# Patient Record
Sex: Male | Born: 1985 | Race: White | Hispanic: No | Marital: Married | Smoking: Former smoker
Health system: Southern US, Community
[De-identification: ages and names within clinical notes are randomized; demographics above are authoritative.]

## PROBLEM LIST (undated history)

## (undated) DIAGNOSIS — G510 Bell's palsy: Secondary | ICD-10-CM

## (undated) DIAGNOSIS — Q369 Cleft lip, unilateral: Secondary | ICD-10-CM

## (undated) HISTORY — PX: HIP SURGERY: SHX245

## (undated) HISTORY — DX: Bell's palsy: G51.0

---
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
## 2010-09-24 ENCOUNTER — Ambulatory Visit (INDEPENDENT_AMBULATORY_CARE_PROVIDER_SITE_OTHER): Payer: Self-pay

## 2010-09-24 ENCOUNTER — Inpatient Hospital Stay (INDEPENDENT_AMBULATORY_CARE_PROVIDER_SITE_OTHER)
Admission: RE | Admit: 2010-09-24 | Discharge: 2010-09-24 | Disposition: A | Discharge status: Home / Self Care | Payer: Self-pay | Source: Ambulatory Visit | Attending: Family Medicine | Admitting: Family Medicine

## 2010-09-24 DIAGNOSIS — IMO0002 Reserved for concepts with insufficient information to code with codable children: Secondary | ICD-10-CM

## 2010-09-24 DIAGNOSIS — X58XXXA Exposure to other specified factors, initial encounter: Secondary | ICD-10-CM

## 2010-12-24 ENCOUNTER — Emergency Department (HOSPITAL_COMMUNITY)
Admission: EM | Admit: 2010-12-24 | Discharge: 2010-12-24 | Disposition: A | Discharge status: Home / Self Care | Payer: Self-pay | Attending: Emergency Medicine | Admitting: Emergency Medicine

## 2010-12-24 ENCOUNTER — Encounter: Payer: Self-pay | Admitting: *Deleted

## 2010-12-24 DIAGNOSIS — G510 Bell's palsy: Secondary | ICD-10-CM | POA: Insufficient documentation

## 2010-12-24 DIAGNOSIS — R29810 Facial weakness: Secondary | ICD-10-CM | POA: Insufficient documentation

## 2010-12-24 DIAGNOSIS — H9209 Otalgia, unspecified ear: Secondary | ICD-10-CM | POA: Insufficient documentation

## 2010-12-24 MED ORDER — PREDNISONE 10 MG PO TABS: 20.0000 mg | ORAL_TABLET | Freq: Two times a day (BID) | ORAL | Status: AC

## 2010-12-24 NOTE — ED Provider Notes (Signed)
History     CSN: [id number] Arrival date & time: 12/24/2010  8:28 PM   First MD Initiated Contact with Patient 12/24/10 2234      Chief Complaint  Patient presents with  . Ear Fullness    (Consider location/radiation/quality/duration/timing/severity/associated sxs/prior treatment) Patient is a 25 y.o. male presenting with plugged ear sensation. The history is provided by the patient.  Ear Fullness This is a new problem. The current episode started today.   Patient developed left ear pain 2 days ago today he woke up and the left side of his face was not moving like the rest of his face. He states that when he tries to smile his lips won't move. He denies any weakness in any extremities. Or generalized weakness. No change in vision, no headaches, no change in breathing, shortness of breath, chest pain. This has never happened to the patient before. Patient is a healthy young male, with no significant past medical history. History reviewed. No pertinent past medical history.Marland Kitchen  History reviewed. No pertinent past surgical history.  History reviewed. No pertinent family history.  History  Substance Use Topics  . Smoking status: Current Everyday Smoker  . Smokeless tobacco: Not on file  . Alcohol Use: Yes      Review of Systems  Allergies  Review of patient's allergies indicates no known allergies.  Home Medications   Current Outpatient Rx  Name Route Sig Dispense Refill  . OVER THE COUNTER MEDICATION Left Ear Place 3-4 drops into the left ear 4 (four) times daily as needed. Hyland's earache drops       BP 118/53  Pulse 66  Temp(Src) 98.1 F (36.7 C) (Oral)  Resp 18  SpO2 96%  Physical Exam  Vitals reviewed. Constitutional: He is oriented to person, place, and time. He appears well-developed and well-nourished.  HENT:  Head: Normocephalic and atraumatic.  Eyes: EOM are normal. Pupils are equal, round, and reactive to light.  Neck: Normal range of motion.    Cardiovascular: Normal rate and regular rhythm.   Pulmonary/Chest: Effort normal and breath sounds normal.  Musculoskeletal: Normal range of motion.  Neurological: He is alert and oriented to person, place, and time. No cranial nerve deficit (motor function of the facial nerve not physiologic.).  Skin: Skin is warm and dry.    ED Course  Procedures (including critical care time)  Labs Reviewed - No data to display No results found.   No diagnosis found.    MDM  Pt does not have risk factors for stroke, no pain, no generalized or focal weakness. Symptoms are purely motor of the left facial nerve.       Dorthula Matas, PA 12/24/10 2300

## 2010-12-24 NOTE — ED Notes (Signed)
Pt presents with left sided facial numbness and left ear fullness for the past 2 days.  No hx of same.  Other neuro exam normal.  Grip strength equal and bilateral, oriented x 3.

## 2010-12-24 NOTE — ED Notes (Signed)
The pt has had some lt ear pain for 2 days and now his lt face is numb

## 2010-12-25 NOTE — ED Provider Notes (Signed)
Medical screening examination/treatment/procedure(s) were performed by non-physician practitioner and as supervising physician I was immediately available for consultation/collaboration.   Shelda Jakes, MD 12/25/10 [phone]

## 2011-03-29 ENCOUNTER — Ambulatory Visit: Payer: Self-pay | Admitting: Family Medicine

## 2011-03-29 VITALS — BP 123/65 | HR 68 | Temp 98.7°F | Resp 16 | Ht 68.5 in | Wt 171.2 lb

## 2011-03-29 DIAGNOSIS — L259 Unspecified contact dermatitis, unspecified cause: Secondary | ICD-10-CM

## 2011-03-29 DIAGNOSIS — R21 Rash and other nonspecific skin eruption: Secondary | ICD-10-CM

## 2011-03-29 DIAGNOSIS — L309 Dermatitis, unspecified: Secondary | ICD-10-CM

## 2011-03-29 MED ORDER — TRIAMCINOLONE ACETONIDE 0.1 % EX CREA: TOPICAL_CREAM | Freq: Two times a day (BID) | CUTANEOUS | Status: DC

## 2011-03-29 MED ORDER — SULFAMETHOXAZOLE-TRIMETHOPRIM 800-160 MG PO TABS: 1.0000 | ORAL_TABLET | Freq: Two times a day (BID) | ORAL | Status: AC

## 2011-03-29 NOTE — Progress Notes (Signed)
[facility]:  Office Visit  Chief Complaint:  Chief Complaint  Patient presents with  . Rash    L arm x 2 wks    HPI: David Schmitt is a 26 y.o. male who complains of rash on left upper and lower arm x 2 weeks, started off as small area on antecubital area but started spreading because of scratching due to itching, some serosainguinous drainage. Patient has multiple tattoos along left arm.  Tried antibacterial soap and eczema cream without relief. Denies any recent travels, new detergents or soaps, food allergies, new clothing/sheets, insect bites, pet/animal exposure, camping, antibiotic use. Denies any h/o asthma, allergies, eczema.   Past Medical History  Diagnosis Date  . Bell's palsy    History reviewed. No pertinent past surgical history. History   Social History  . Marital Status: Single    Spouse Name: N/A    Number of Children: N/A  . Years of Education: N/A   Social History Main Topics  . Smoking status: Current Everyday Smoker -- 1.0 packs/day for 5 years    Types: Cigarettes  . Smokeless tobacco: None  . Alcohol Use: Yes     weekends  . Drug Use: No  . Sexually Active: Yes   Other Topics Concern  . None   Social History Narrative  . None   No family history on file. No Known Allergies Prior to Admission medications   Medication Sig Start Date End Date Taking? Authorizing Provider  OVER THE COUNTER MEDICATION Place 3-4 drops into the left ear 4 (four) times daily as needed. Hyland's earache drops     Historical Provider, MD     ROS: The patient denies fevers, chills, night sweats, unintentional weight loss, chest pain, palpitations, wheezing, dyspnea on exertion, nausea, vomiting, abdominal pain, dysuria, hematuria, melena, numbness, weakness, or tingling. + rash  All other systems have been reviewed and were otherwise negative with the exception of those mentioned in the HPI and as above.    PHYSICAL EXAM: Filed Vitals:   03/29/11 1631  BP: 123/65  Pulse: 68  Temp: 98.7 F (37.1 C)  Resp: 16   Filed Vitals:   03/29/11 1631  Height: 5' 8.5" (1.74 m)  Weight: 171 lb 3.2 oz (77.656 kg)   Body mass index is 25.65 kg/(m^2).  General: Alert, no acute distress HEENT:  Normocephalic, atraumatic, oropharynx patent. Cardiovascular:  Regular rate and rhythm, no rubs murmurs or gallops.  No Carotid bruits, radial pulse intact. No pedal edema.  Respiratory: Clear to auscultation bilaterally.  No wheezes, rales, or rhonchi.  No cyanosis, no use of accessory musculature GI: No organomegaly, abdomen is soft and non-tender, positive bowel sounds.  No masses. Skin: ++ Excoriated rash, macular papular rash diffuse on left arm. This is mixed in with the tattoos along his arm--tattoo sleeve Neurologic: Facial musculature symmetric. Psychiatric: Patient is appropriate throughout our interaction. Lymphatic: No axillary or cervical lymphadenopathy Musculoskeletal: Gait intact.   ASSESSMENT/PLAN: Encounter Diagnoses  Name Primary?  . Rash and nonspecific skin eruption Yes  . Dermatitis    1. Rx Triamcinolone for itching and Bactrim for excoriation and prevention of possible secondary infection. Patient given risk and benefits of steroid cream use for prolong period of time, he should be mindful of pigmentation changes to his tattoos. Recommended that he try a small area first to see if there are any SEs.   2. F/u prn    Alacia Rehmann PHUONG, DO 03/29/2011 4:52 PM

## 2012-01-19 ENCOUNTER — Emergency Department (HOSPITAL_COMMUNITY): Payer: Self-pay

## 2012-01-19 ENCOUNTER — Encounter (HOSPITAL_COMMUNITY): Payer: Self-pay | Admitting: *Deleted

## 2012-01-19 ENCOUNTER — Emergency Department (HOSPITAL_COMMUNITY)
Admission: EM | Admit: 2012-01-19 | Discharge: 2012-01-19 | Disposition: A | Discharge status: Home / Self Care | Payer: No Typology Code available for payment source | Attending: Emergency Medicine | Admitting: Emergency Medicine

## 2012-01-19 DIAGNOSIS — F17200 Nicotine dependence, unspecified, uncomplicated: Secondary | ICD-10-CM | POA: Insufficient documentation

## 2012-01-19 DIAGNOSIS — Y9389 Activity, other specified: Secondary | ICD-10-CM | POA: Insufficient documentation

## 2012-01-19 DIAGNOSIS — S199XXA Unspecified injury of neck, initial encounter: Secondary | ICD-10-CM | POA: Insufficient documentation

## 2012-01-19 DIAGNOSIS — S0993XA Unspecified injury of face, initial encounter: Secondary | ICD-10-CM | POA: Insufficient documentation

## 2012-01-19 DIAGNOSIS — S298XXA Other specified injuries of thorax, initial encounter: Secondary | ICD-10-CM | POA: Insufficient documentation

## 2012-01-19 DIAGNOSIS — Z87720 Personal history of (corrected) congenital malformations of eye: Secondary | ICD-10-CM | POA: Insufficient documentation

## 2012-01-19 DIAGNOSIS — S3981XA Other specified injuries of abdomen, initial encounter: Secondary | ICD-10-CM | POA: Insufficient documentation

## 2012-01-19 DIAGNOSIS — Y92410 Unspecified street and highway as the place of occurrence of the external cause: Secondary | ICD-10-CM | POA: Insufficient documentation

## 2012-01-19 DIAGNOSIS — T07XXXA Unspecified multiple injuries, initial encounter: Secondary | ICD-10-CM | POA: Insufficient documentation

## 2012-01-19 DIAGNOSIS — Z8669 Personal history of other diseases of the nervous system and sense organs: Secondary | ICD-10-CM | POA: Insufficient documentation

## 2012-01-19 HISTORY — DX: Cleft lip, unilateral: Q36.9

## 2012-01-19 LAB — COMPREHENSIVE METABOLIC PANEL
AST: 26 U/L (ref 0–37)
Albumin: 3.9 g/dL (ref 3.5–5.2)
Alkaline Phosphatase: 43 U/L (ref 39–117)
BUN: 10 mg/dL (ref 6–23)
Chloride: 101 mEq/L (ref 96–112)
Creatinine, Ser: 0.89 mg/dL (ref 0.50–1.35)
Potassium: 3.2 mEq/L — ABNORMAL LOW (ref 3.5–5.1)
Total Protein: 7.1 g/dL (ref 6.0–8.3)

## 2012-01-19 LAB — URINALYSIS, ROUTINE W REFLEX MICROSCOPIC
Glucose, UA: NEGATIVE mg/dL
Leukocytes, UA: NEGATIVE
Nitrite: NEGATIVE
Specific Gravity, Urine: 1.005 (ref 1.005–1.030)
pH: 5.5 (ref 5.0–8.0)

## 2012-01-19 LAB — RAPID URINE DRUG SCREEN, HOSP PERFORMED
Amphetamines: NOT DETECTED
Benzodiazepines: NOT DETECTED
Cocaine: NOT DETECTED

## 2012-01-19 LAB — CBC WITH DIFFERENTIAL/PLATELET
Basophils Absolute: 0 10*3/uL (ref 0.0–0.1)
Basophils Relative: 0 % (ref 0–1)
Eosinophils Absolute: 0.1 10*3/uL (ref 0.0–0.7)
Hemoglobin: 16.1 g/dL (ref 13.0–17.0)
MCH: 32.6 pg (ref 26.0–34.0)
MCHC: 35.8 g/dL (ref 30.0–36.0)
Monocytes Relative: 6 % (ref 3–12)
Neutro Abs: 5 10*3/uL (ref 1.7–7.7)
Neutrophils Relative %: 69 % (ref 43–77)
RDW: 12.2 % (ref 11.5–15.5)

## 2012-01-19 LAB — ETHANOL: Alcohol, Ethyl (B): 198 mg/dL — ABNORMAL HIGH (ref 0–11)

## 2012-01-19 MED ORDER — IBUPROFEN 600 MG PO TABS: 600.0000 mg | ORAL_TABLET | Freq: Four times a day (QID) | ORAL | Status: DC | PRN

## 2012-01-19 MED ORDER — POTASSIUM CHLORIDE CRYS ER 20 MEQ PO TBCR
20.0000 meq | EXTENDED_RELEASE_TABLET | Freq: Once | ORAL | Status: AC
  Administered 2012-01-19: 20 meq via ORAL
  Filled 2012-01-19: qty 1

## 2012-01-19 MED ORDER — IOHEXOL 300 MG/ML  SOLN
100.0000 mL | Freq: Once | INTRAMUSCULAR | Status: AC | PRN
  Administered 2012-01-19: 100 mL via INTRAVENOUS

## 2012-01-19 MED ORDER — SODIUM CHLORIDE 0.9 % IV SOLN
Freq: Once | INTRAVENOUS | Status: AC
  Administered 2012-01-19: 02:00:00 via INTRAVENOUS

## 2012-01-19 NOTE — ED Notes (Signed)
Pt ambulated to restroom and maintained o2 stats.
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
## 2012-01-19 NOTE — ED Notes (Addendum)
Radiology called to inform RN that ems IV had infiltrated. Radiology dc IV and restarted another IV

## 2012-01-19 NOTE — ED Provider Notes (Signed)
Medical screening examination/treatment/procedure(s) were performed by non-physician practitioner and as supervising physician I was immediately available for consultation/collaboration.  Sunnie Nielsen, MD 01/19/12 [phone]
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
## 2014-11-18 ENCOUNTER — Ambulatory Visit (INDEPENDENT_AMBULATORY_CARE_PROVIDER_SITE_OTHER): Payer: Self-pay | Admitting: Family Medicine

## 2014-11-18 VITALS — BP 112/68 | HR 72 | Temp 98.9°F | Resp 18 | Ht 70.0 in | Wt 213.4 lb

## 2014-11-18 DIAGNOSIS — M25561 Pain in right knee: Secondary | ICD-10-CM

## 2014-11-18 DIAGNOSIS — L03115 Cellulitis of right lower limb: Secondary | ICD-10-CM

## 2014-11-18 MED ORDER — DOXYCYCLINE HYCLATE 100 MG PO CAPS: 100.0000 mg | ORAL_CAPSULE | Freq: Two times a day (BID) | ORAL | Status: DC

## 2014-11-18 MED ORDER — NAPROXEN SODIUM 550 MG PO TABS: 550.0000 mg | ORAL_TABLET | Freq: Two times a day (BID) | ORAL | Status: DC

## 2014-11-18 NOTE — Patient Instructions (Addendum)
Cellulitis Cellulitis is an infection of the skin and the tissue beneath it. The infected area is usually red and tender. Cellulitis occurs most often in the arms and lower legs.  CAUSES  Cellulitis is caused by bacteria that enter the skin through cracks or cuts in the skin. The most common types of bacteria that cause cellulitis are staphylococci and streptococci. SIGNS AND SYMPTOMS   Redness and warmth.  Swelling.  Tenderness or pain.  Fever. DIAGNOSIS  Your health care provider can usually determine what is wrong based on a physical exam. Blood tests may also be done. TREATMENT  Treatment usually involves taking an antibiotic medicine. HOME CARE INSTRUCTIONS   Take your antibiotic medicine as directed by your health care provider. Finish the antibiotic even if you start to feel better.  Keep the infected arm or leg elevated to reduce swelling.  Apply a warm cloth to the affected area up to 4 times per day to relieve pain.  Take medicines only as directed by your health care provider.  Keep all follow-up visits as directed by your health care provider. SEEK MEDICAL CARE IF:   You notice red streaks coming from the infected area.  Your red area gets larger or turns dark in color.  Your bone or joint underneath the infected area becomes painful after the skin has healed.  Your infection returns in the same area or another area.  You notice a swollen bump in the infected area.  You develop new symptoms.  You have a fever. SEEK IMMEDIATE MEDICAL CARE IF:   You feel very sleepy.  You develop vomiting or diarrhea.  You have a general ill feeling (malaise) with muscle aches and pains.   This information is not intended to replace advice given to you by your health care provider. Make sure you discuss any questions you have with your health care provider.   Document Released: 10/31/2004 Document Revised: 10/12/2014 Document Reviewed: 04/08/2011 Elsevier Interactive  Patient Education 2016 Elsevier Inc.    Doxycycline tablets or capsules What is this medicine? DOXYCYCLINE (dox i SYE kleen) is a tetracycline antibiotic. It kills certain bacteria or stops their growth. It is used to treat many kinds of infections, like dental, skin, respiratory, and urinary tract infections. It also treats acne, Lyme disease, malaria, and certain sexually transmitted infections. This medicine may be used for other purposes; ask your health care provider or pharmacist if you have questions. What should I tell my health care provider before I take this medicine? They need to know if you have any of these conditions: -liver disease -long exposure to sunlight like working outdoors -stomach problems like colitis -an unusual or allergic reaction to doxycycline, tetracycline antibiotics, other medicines, foods, dyes, or preservatives -pregnant or trying to get pregnant -breast-feeding How should I use this medicine? Take this medicine by mouth with a full glass of water. Follow the directions on the prescription label. It is best to take this medicine without food, but if it upsets your stomach take it with food. Take your medicine at regular intervals. Do not take your medicine more often than directed. Take all of your medicine as directed even if you think you are better. Do not skip doses or stop your medicine early. Talk to your pediatrician regarding the use of this medicine in children. While this drug may be prescribed for selected conditions, precautions do apply. Overdosage: If you think you have taken too much of this medicine contact a poison control center or  emergency room at once. NOTE: This medicine is only for you. Do not share this medicine with others. What if I miss a dose? If you miss a dose, take it as soon as you can. If it is almost time for your next dose, take only that dose. Do not take double or extra doses. What may interact with this  medicine? -antacids -barbiturates -birth control pills -bismuth subsalicylate -carbamazepine -methoxyflurane -other antibiotics -phenytoin -vitamins that contain iron -warfarin This list may not describe all possible interactions. Give your health care provider a list of all the medicines, herbs, non-prescription drugs, or dietary supplements you use. Also tell them if you smoke, drink alcohol, or use illegal drugs. Some items may interact with your medicine. What should I watch for while using this medicine? Tell your doctor or health care professional if your symptoms do not improve. Do not treat diarrhea with over the counter products. Contact your doctor if you have diarrhea that lasts more than 2 days or if it is severe and watery. Do not take this medicine just before going to bed. It may not dissolve properly when you lay down and can cause pain in your throat. Drink plenty of fluids while taking this medicine to also help reduce irritation in your throat. This medicine can make you more sensitive to the sun. Keep out of the sun. If you cannot avoid being in the sun, wear protective clothing and use sunscreen. Do not use sun lamps or tanning beds/booths. Birth control pills may not work properly while you are taking this medicine. Talk to your doctor about using an extra method of birth control. If you are being treated for a sexually transmitted infection, avoid sexual contact until you have finished your treatment. Your sexual partner may also need treatment. Avoid antacids, aluminum, calcium, magnesium, and iron products for 4 hours before and 2 hours after taking a dose of this medicine. If you are using this medicine to prevent malaria, you should still protect yourself from contact with mosquitos. Stay in screened-in areas, use mosquito nets, keep your body covered, and use an insect repellent. What side effects may I notice from receiving this medicine? Side effects that you  should report to your doctor or health care professional as soon as possible: -allergic reactions like skin rash, itching or hives, swelling of the face, lips, or tongue -difficulty breathing -fever -itching in the rectal or genital area -pain on swallowing -redness, blistering, peeling or loosening of the skin, including inside the mouth -severe stomach pain or cramps -unusual bleeding or bruising -unusually weak or tired -yellowing of the eyes or skin Side effects that usually do not require medical attention (report to your doctor or health care professional if they continue or are bothersome): -diarrhea -loss of appetite -nausea, vomiting This list may not describe all possible side effects. Call your doctor for medical advice about side effects. You may report side effects to FDA at 1-800-FDA-1088. Where should I keep my medicine? Keep out of the reach of children. Store at room temperature, below 30 degrees C (86 degrees F). Protect from light. Keep container tightly closed. Throw away any unused medicine after the expiration date. Taking this medicine after the expiration date can make you seriously ill. NOTE: This sheet is a summary. It may not cover all possible information. If you have questions about this medicine, talk to your doctor, pharmacist, or health care provider.    2016, Elsevier/Gold Standard. (2014-05-13 12:10:28)

## 2014-11-18 NOTE — Progress Notes (Signed)
    MRN: [id number] DOB: 03-20-1985  Subjective:   David Schmitt is a 29 y.o. male presenting for chief complaint of Cyst  Reports 2 day history of right knee pain, right knee swelling, redness, increasing tenderness. Has not tried any medications for relief. Of note, patient admits that he looked up some videos on Youtube and started picking at his wounds which subsequently worsened his knee pain, redness and swelling. Denies fever, drainage pus, bleeding, trauma, recent tattoos over affected area. Denies history of MRSA. Denies any other aggravating or relieving factors, no other questions or concerns.  David Schmitt has a current medication list which includes the following prescription(s): doxycycline and naproxen sodium. Also has No Known Allergies.  David Schmitt  has a past medical history of Bell's palsy and Cleft lip. Also  has past surgical history that includes Hip surgery.  Objective:   Vitals: BP 112/68 mmHg  Pulse 72  Temp(Src) 98.9 F (37.2 C) (Oral)  Resp 18  Ht 5\' 10"  (1.778 m)  Wt 213 lb 6.4 oz (96.798 kg)  BMI 30.62 kg/m2  SpO2 98%  Physical Exam  Constitutional: He is oriented to person, place, and time. He appears well-developed and well-nourished.  Cardiovascular: Normal rate.   Pulmonary/Chest: Effort normal.  Musculoskeletal:       Right knee: He exhibits decreased range of motion (flexion), swelling and erythema. He exhibits no effusion, no ecchymosis, no deformity and no laceration.       Legs: Multiple tattoos over right thigh and calf.  Neurological: He is alert and oriented to person, place, and time.  Skin: Skin is warm and dry. No rash noted. There is erythema. No pallor.    Assessment and Plan :   1. Cellulitis of right lower extremity 2. Right knee pain - Start doxycycline twice a day with food for 10 days. Patient may take Anaprox as well for pain and inflammation. He is to return to clinic in 3-4 days if he has no improvement or sooner if worsening  symptoms.  Wallis BambergMario Marium Ragan, PA-C [facility] [phone] 11/18/2014 4:28 PM  I have reviewed history and physical exam at the bedside and agree with the assessment and plan. Elvina SidleKurt Lauenstein M.D.

## 2015-06-02 ENCOUNTER — Other Ambulatory Visit: Payer: Self-pay | Admitting: Physician Assistant

## 2015-06-02 ENCOUNTER — Ambulatory Visit (INDEPENDENT_AMBULATORY_CARE_PROVIDER_SITE_OTHER): Payer: No Typology Code available for payment source | Admitting: Physician Assistant

## 2015-06-02 VITALS — BP 120/68 | HR 61 | Temp 98.4°F | Resp 14 | Ht 70.0 in | Wt 210.8 lb

## 2015-06-02 DIAGNOSIS — R519 Headache, unspecified: Secondary | ICD-10-CM

## 2015-06-02 DIAGNOSIS — R5383 Other fatigue: Secondary | ICD-10-CM | POA: Diagnosis not present

## 2015-06-02 DIAGNOSIS — R51 Headache: Secondary | ICD-10-CM | POA: Diagnosis not present

## 2015-06-02 LAB — BASIC METABOLIC PANEL
BUN: 18 mg/dL (ref 7–25)
CALCIUM: 9.5 mg/dL (ref 8.6–10.3)
CO2: 28 mmol/L (ref 20–31)
CREATININE: 0.92 mg/dL (ref 0.60–1.35)
Chloride: 99 mmol/L (ref 98–110)
GLUCOSE: 93 mg/dL (ref 65–99)
Potassium: 4.2 mmol/L (ref 3.5–5.3)
Sodium: 137 mmol/L (ref 135–146)

## 2015-06-02 LAB — POCT CBC
GRANULOCYTE PERCENT: 36.2 % — AB (ref 37–80)
HEMATOCRIT: 42.6 % — AB (ref 43.5–53.7)
Hemoglobin: 15.1 g/dL (ref 14.1–18.1)
Lymph, poc: 3.6 — AB (ref 0.6–3.4)
MCH, POC: 31.7 pg — AB (ref 27–31.2)
MCHC: 35.6 g/dL — AB (ref 31.8–35.4)
MCV: 89.3 fL (ref 80–97)
MID (CBC): 0.8 (ref 0–0.9)
MPV: 7.9 fL (ref 0–99.8)
POC GRANULOCYTE: 2.5 (ref 2–6.9)
POC LYMPH %: 52.5 % — AB (ref 10–50)
POC MID %: 11.3 % (ref 0–12)
Platelet Count, POC: 196 10*3/uL (ref 142–424)
RBC: 4.77 M/uL (ref 4.69–6.13)
RDW, POC: 12.8 %
WBC: 6.8 10*3/uL (ref 4.6–10.2)

## 2015-06-02 LAB — POCT SEDIMENTATION RATE: POCT SED RATE: 15 mm/h (ref 0–22)

## 2015-06-02 LAB — TSH: TSH: 1.44 mIU/L (ref 0.40–4.50)

## 2015-06-02 NOTE — Progress Notes (Signed)
[facility] [address] [place] [address] [phone]  Date:  06/02/2015   Name:  David Schmitt   DOB:  [birth year]   MRN:  [id number]  PCP:  Pcp Not In System   Chief Complaint  Patient presents with  . Migraine  . Fatigue   History of Present Illness:  David Schmitt is a 30 y.o. male patient who presents to [facility] for headache and fatigue.     Headache started 7 days ago.  Left temporal pain.  Taking aleve or advil which does not help.  Throbbing pain and the back of his head.  No nausea associated.  There is photosensitivity.  No vision changes or auras.  Mild dizziness.  The head pain is off and on.  No UR symptoms.  No pain with the pollen.  He will take 2 capsules of the aleve.  He has an infant at home, and has intermittent sleep.  He denies exposure to mononucleosis.  He has no personal or familial hx of thyroid disease.      There are no active problems to display for this patient.   Past Medical History  Diagnosis Date  . Bell's palsy   . Cleft lip     Past Surgical History  Procedure Laterality Date  . Hip surgery      Social History  Substance Use Topics  . Smoking status: Current Every Day Smoker -- 1.00 packs/day for 5 years    Types: Cigarettes  . Smokeless tobacco: None  . Alcohol Use: Yes     Comment: weekends    History reviewed. No pertinent family history.  No Known Allergies  Medication list has been reviewed and updated.  Current Outpatient Prescriptions on File Prior to Visit  Medication Sig Dispense Refill  . doxycycline (VIBRAMYCIN) 100 MG capsule Take 1 capsule (100 mg total) by mouth 2 (two) times daily. (Patient not taking: Reported on 06/02/2015) 20 capsule 0  . naproxen sodium (ANAPROX DS) 550 MG tablet Take 1 tablet (550 mg total) by mouth 2 (two) times daily with a meal. (Patient not taking: Reported on 06/02/2015) 30 tablet 0   No current facility-administered medications on file prior to visit.     ROS ROS otherwise unremarkable unless listed above.   Physical Examination: BP 120/68 mmHg  Pulse 61  Temp(Src) 98.4 F (36.9 C) (Oral)  Resp 14  Ht  (1.778 m)  Wt 210 lb 12.8 oz (95.618 kg)  BMI 30.25 kg/m2  SpO2 98% Ideal Body Weight: Weight in (lb) to have BMI = 25: 173.9  Physical Exam  Constitutional: He is oriented to person, place, and time. He appears well-developed and well-nourished. No distress.  HENT:  Head: Normocephalic and atraumatic.  Right Ear: External ear and ear canal normal.  Left Ear: External ear and ear canal normal.  Nose: Nasal deformity present. Right sinus exhibits no maxillary sinus tenderness and no frontal sinus tenderness. Left sinus exhibits no maxillary sinus tenderness and no frontal sinus tenderness.  Mouth/Throat: No oropharyngeal exudate, posterior oropharyngeal edema or posterior oropharyngeal erythema.  Eyes: Conjunctivae are normal. Pupils are equal, round, and reactive to light. Right eye exhibits normal extraocular motion. Left eye exhibits normal extraocular motion.  Cardiovascular: Normal rate.   Pulmonary/Chest: Effort normal. No respiratory distress.  Neurological: He is alert and oriented to person, place, and time. He has normal strength. No cranial nerve deficit.  Skin: Skin is warm and dry. He is not diaphoretic.  Psychiatric: He has a normal mood and affect. His behavior is normal.   Orthostatic VS for the past 24 hrs (Last 3 readings):  BP- Lying Pulse- Lying BP- Sitting Pulse- Sitting BP- Standing at 0 minutes Pulse- Standing at 0 minutes  06/02/15 1427 122/76 mmHg 50 117/74 mmHg 50 119/77 mmHg 54    Assessment and Plan: David Schmitt is a 30 y.o. male who is here today for cc of abdominal pain.   Have advised him to use Excedrin migraine at this time. His vitals are within normal limits I have ordered multiple labs to address this possible fatigue, however he does have a small child at home and does battle with  intermittent sleep due to that. Discussed alarming symptoms that would warrant an immediate return. I've advised heavy hydration and balanced meals.  Other fatigue - Plan: POCT CBC, POCT SEDIMENTATION RATE, Basic metabolic panel, TSH, HIV antibody  Left temporal headache - Plan: POCT CBC, POCT SEDIMENTATION RATE  Trena PlattStephanie English, PA-C [facility] 06/02/2015 2:00 PM

## 2015-06-02 NOTE — Patient Instructions (Addendum)
IF you received an x-ray today, you will receive an invoice from [facility]. Please contact [facility] at [phone] with questions or concerns regarding your invoice.   IF you received labwork today, you will receive an invoice from [facility]. Please contact [facility] at [phone] with questions or concerns regarding your invoice.   Our billing staff will not be able to assist you with questions regarding bills from these companies.  You will be contacted with the lab results as soon as they are available. The fastest way to get your results is to activate your My Chart account. Instructions are located on the last page of this paperwork. If you have not heard from us regarding the results in 2 weeks, please contact this office.    At this time, I owuld like you to push fluids at this time.  Make sure that you are getting at least 64 oz of water if not more.  Make sure that your vision exams stay up to date.   And you may want to opt for sunglasses when outdoors.   I would like you to make sure that you are eating balanced meals.  Please try the over the counter Excedrin Migraine at this time.  [place] as prescribed.  You will let me know if there is no improvement in one week. I will be in touch with you, pending the lab results.  General Headache Without Cause A headache is pain or discomfort felt around the head or neck area. There are many causes and types of headaches. In some cases, the cause may not be found.  HOME CARE  Managing Pain  Take over-the-counter and prescription medicines only as told by your doctor.  Lie down in a dark, quiet room when you have a headache.  If directed, apply ice to the head and neck area:  Put ice in a plastic bag.  Place a towel between your skin and the bag.  Leave the ice on for 20 minutes, 2-3 times per day.  Use a heating pad or hot shower to apply heat to the head and neck area as told by  your doctor.  Keep lights dim if bright lights bother you or make your headaches worse. Eating and Drinking  Eat meals on a regular schedule.  Lessen how much alcohol you drink.  Lessen how much caffeine you drink, or stop drinking caffeine. General Instructions  Keep all follow-up visits as told by your doctor. This is important.  Keep a journal to find out if certain things bring on headaches. For example, write down:  What you eat and drink.  How much sleep you get.  Any change to your diet or medicines.  Relax by getting a massage or doing other relaxing activities.  Lessen stress.  Sit up straight. Do not tighten (tense) your muscles.  Do not use tobacco products. This includes cigarettes, chewing tobacco, or e-cigarettes. If you need help quitting, ask your doctor.  Exercise regularly as told by your doctor.  Get enough sleep. This often means 7-9 hours of sleep. GET HELP IF:  Your symptoms are not helped by medicine.  You have a headache that feels different than the other headaches.  You feel sick to your stomach (nauseous) or you throw up (vomit).  You have a fever. GET HELP RIGHT AWAY IF:   Your headache becomes really bad.  You keep throwing up.  You have a stiff neck.  You have trouble  seeing.  You have trouble speaking.  You have pain in the eye or ear.  Your muscles are weak or you lose muscle control.  You lose your balance or have trouble walking.  You feel like you will pass out (faint) or you pass out.  You have confusion.   This information is not intended to replace advice given to you by your health care provider. Make sure you discuss any questions you have with your health care provider.   Document Released: 10/31/2007 Document Revised: 10/12/2014 Document Reviewed: 05/16/2014 Elsevier Interactive Patient Education [employer]

## 2015-06-03 LAB — HIV ANTIBODY (ROUTINE TESTING W REFLEX): HIV 1&2 Ab, 4th Generation: NONREACTIVE

## 2015-06-05 ENCOUNTER — Telehealth: Payer: Self-pay | Admitting: Emergency Medicine

## 2015-06-05 DIAGNOSIS — R109 Unspecified abdominal pain: Secondary | ICD-10-CM

## 2015-06-05 NOTE — Telephone Encounter (Signed)
-----   Message from Garnetta BuddyStephanie D English, GeorgiaPA sent at 06/04/2015  7:06 AM EDT ----- Please alert that thyroid function appears normal, and the HIV is negative.  I would advise that he allow 2 weeks, and see if he is not feeling any better.  Please add hepatic panel at this time, if he is fine with this.

## 2015-06-05 NOTE — Telephone Encounter (Signed)
Pt given test results. Advised to come in for further blood work if no improvement in 2 weeks. He is fine with blood work. Hepatic panel placed for future lab

## 2015-06-08 LAB — HEPATIC FUNCTION PANEL
ALK PHOS: 144 U/L — AB (ref 40–115)
ALT: 210 U/L — AB (ref 9–46)
AST: 90 U/L — ABNORMAL HIGH (ref 10–40)
Albumin: 4.2 g/dL (ref 3.6–5.1)
TOTAL PROTEIN: 7.2 g/dL (ref 6.1–8.1)
Total Bilirubin: 0.2 mg/dL (ref 0.2–1.2)

## 2015-06-09 ENCOUNTER — Other Ambulatory Visit: Payer: Self-pay | Admitting: Physician Assistant

## 2015-06-09 NOTE — Progress Notes (Signed)
Done

## 2015-06-09 NOTE — Progress Notes (Unsigned)
Please also call [facility] and order an ebv titer

## 2015-06-12 ENCOUNTER — Other Ambulatory Visit (INDEPENDENT_AMBULATORY_CARE_PROVIDER_SITE_OTHER): Payer: No Typology Code available for payment source

## 2015-06-12 DIAGNOSIS — R109 Unspecified abdominal pain: Secondary | ICD-10-CM | POA: Diagnosis not present

## 2015-06-12 LAB — HEPATIC FUNCTION PANEL
ALBUMIN: 3.9 g/dL (ref 3.6–5.1)
ALK PHOS: 106 U/L (ref 40–115)
ALT: 92 U/L — AB (ref 9–46)
AST: 51 U/L — AB (ref 10–40)
BILIRUBIN INDIRECT: 0.3 mg/dL (ref 0.2–1.2)
Bilirubin, Direct: 0.1 mg/dL (ref <=0.2)
TOTAL PROTEIN: 7.1 g/dL (ref 6.1–8.1)
Total Bilirubin: 0.4 mg/dL (ref 0.2–1.2)

## 2015-06-12 LAB — LIPASE: Lipase: 26 U/L (ref 7–60)

## 2015-06-12 NOTE — Addendum Note (Signed)
Addended by: Felix AhmadiFRANSEN, Yaneli Keithley A on: 06/12/2015 02:58 PM   Modules accepted: Orders

## 2015-06-13 LAB — EPSTEIN-BARR VIRUS VCA ANTIBODY PANEL
EBV EA IgG: <5 U/mL (ref <9.0)
EBV NA IgG: 482 U/mL — ABNORMAL HIGH (ref <18.0)
EBV VCA IgG: >750 U/mL — ABNORMAL HIGH (ref <18.0)
EBV VCA IgM: 10.2 U/mL (ref <36.0)

## 2015-06-23 ENCOUNTER — Telehealth: Payer: Self-pay

## 2015-06-23 NOTE — Telephone Encounter (Signed)
David CornfieldStephanie if you could review the lab you added  and call pt. Because he is would like he wants to understand his lab values [phone]

## 2015-06-27 ENCOUNTER — Other Ambulatory Visit: Payer: Self-pay | Admitting: Physician Assistant

## 2015-06-27 DIAGNOSIS — R748 Abnormal levels of other serum enzymes: Secondary | ICD-10-CM

## 2015-06-27 NOTE — Telephone Encounter (Signed)
Discussed lab results.  He will return for hepatitis panel tomorrow.  I will recheck his hepatic function in 1 month.

## 2015-06-28 ENCOUNTER — Other Ambulatory Visit (INDEPENDENT_AMBULATORY_CARE_PROVIDER_SITE_OTHER): Payer: No Typology Code available for payment source | Admitting: *Deleted

## 2015-06-28 DIAGNOSIS — R748 Abnormal levels of other serum enzymes: Secondary | ICD-10-CM

## 2015-06-28 LAB — ACUTE HEP PANEL AND HEP B SURFACE AB
HCV AB: NEGATIVE
HEP A IGM: NONREACTIVE
Hep B C IgM: NONREACTIVE
Hep B S Ab: POSITIVE — AB
Hepatitis B Surface Ag: NEGATIVE

## 2015-08-16 ENCOUNTER — Other Ambulatory Visit: Payer: Self-pay | Admitting: *Deleted

## 2015-08-16 DIAGNOSIS — R7989 Other specified abnormal findings of blood chemistry: Secondary | ICD-10-CM

## 2015-08-16 DIAGNOSIS — R945 Abnormal results of liver function studies: Principal | ICD-10-CM

## 2016-11-12 NOTE — Telephone Encounter (Signed)
error 

## 2017-08-15 ENCOUNTER — Ambulatory Visit: Payer: Self-pay | Admitting: Physician Assistant

## 2017-08-15 ENCOUNTER — Encounter: Payer: Self-pay | Admitting: Physician Assistant

## 2017-08-15 ENCOUNTER — Other Ambulatory Visit: Payer: Self-pay

## 2017-08-15 ENCOUNTER — Ambulatory Visit: Payer: Self-pay | Admitting: *Deleted

## 2017-08-15 VITALS — BP 118/79 | HR 76 | Temp 99.6°F | Resp 16 | Ht 70.0 in | Wt 212.4 lb

## 2017-08-15 DIAGNOSIS — R61 Generalized hyperhidrosis: Secondary | ICD-10-CM

## 2017-08-15 LAB — POCT URINALYSIS DIP (MANUAL ENTRY)
Bilirubin, UA: NEGATIVE
GLUCOSE UA: NEGATIVE mg/dL
Ketones, POC UA: NEGATIVE mg/dL
Leukocytes, UA: NEGATIVE
Nitrite, UA: NEGATIVE
Protein Ur, POC: 30 mg/dL — AB
Spec Grav, UA: 1.025 (ref 1.010–1.025)
UROBILINOGEN UA: 1 U/dL
pH, UA: 6.5 (ref 5.0–8.0)

## 2017-08-15 LAB — GLUCOSE, POCT (MANUAL RESULT ENTRY): POC Glucose: 74 mg/dl (ref 70–99)

## 2017-08-15 NOTE — Progress Notes (Signed)
08/15/2017 5:46 PM   DOB: Oct 03, 1985 / MRN: [id number]  SUBJECTIVE:  David Schmitt is a 32 y.o. male presenting for sweating. This is a chronic problem.  States that he will just walk outside when its hot and seems to sweat more than others.  Associates some night sweats over the last few days.  He feels well otherwise. He denies myalgia, chest pain, SOB, dizziness, weakness, change in dexterity, rash, tic bite, cough, fatigue.   He has No Known Allergies.   He  has a past medical history of Bell's palsy and Cleft lip.    He  reports that he has been smoking cigarettes.  He has a 5.00 pack-year smoking history. He has never used smokeless tobacco. He reports that he drinks alcohol. He reports that he does not use drugs. He  reports that he currently engages in sexual activity. The patient  has a past surgical history that includes Hip surgery.  His family history is not on file.  Review of Systems  Constitutional: Positive for diaphoresis.  Cardiovascular: Negative for chest pain.  Skin: Negative for itching and rash.  Neurological: Negative for dizziness and weakness.    The problem list and medications were reviewed and updated by myself where necessary and exist elsewhere in the encounter.   OBJECTIVE:  BP 118/79   Pulse 76   Temp 99.6 F (37.6 C)   Resp 16   Ht 5\' 10"  (1.778 m)   Wt 212 lb 6.4 oz (96.3 kg)   SpO2 98%   BMI 30.48 kg/m   Wt Readings from Last 3 Encounters:  08/15/17 212 lb 6.4 oz (96.3 kg)  06/02/15 210 lb 12.8 oz (95.6 kg)  11/18/14 213 lb 6.4 oz (96.8 kg)   Temp Readings from Last 3 Encounters:  08/15/17 99.6 F (37.6 C)  06/02/15 98.4 F (36.9 C) (Oral)  11/18/14 98.9 F (37.2 C) (Oral)   BP Readings from Last 3 Encounters:  08/15/17 118/79  06/02/15 120/68  11/18/14 112/68   Pulse Readings from Last 3 Encounters:  08/15/17 76  06/02/15 61  11/18/14 72    Physical Exam  Constitutional: He is oriented to person, place, and time. He  appears well-developed. He is active.  Non-toxic appearance. He does not appear ill.  Eyes: Pupils are equal, round, and reactive to light. Conjunctivae and EOM are normal.  Cardiovascular: Normal rate, regular rhythm, S1 normal, S2 normal, normal heart sounds, intact distal pulses and normal pulses. Exam reveals no gallop and no friction rub.  No murmur heard. Pulmonary/Chest: Effort normal. No stridor. No respiratory distress. He has no wheezes. He has no rales.  Abdominal: Soft. Normal appearance and bowel sounds are normal. He exhibits no distension and no mass. There is no tenderness. There is no rigidity, no rebound, no guarding and no CVA tenderness. No hernia.  Musculoskeletal: Normal range of motion. He exhibits no edema.  Lymphadenopathy:       Head (right side): No submental, no submandibular, no tonsillar, no preauricular, no posterior auricular and no occipital adenopathy present.       Head (left side): No submental, no submandibular, no tonsillar, no preauricular, no posterior auricular and no occipital adenopathy present.    He has no cervical adenopathy.       Right cervical: No superficial cervical, no deep cervical and no posterior cervical adenopathy present.      Left cervical: No superficial cervical, no deep cervical and no posterior cervical adenopathy present.  Neurological:  He is alert and oriented to person, place, and time. He has normal strength and normal reflexes. He is not disoriented. No cranial nerve deficit or sensory deficit. He exhibits normal muscle tone. Coordination and gait normal.  Skin: Skin is warm and dry. He is not diaphoretic. No pallor.  Psychiatric: He has a normal mood and affect. His behavior is normal.  Nursing note and vitals reviewed.       ASSESSMENT AND PLAN:  David Schmitt was seen today for excessive sweating.  Diagnoses and all orders for this visit:  Excessive sweating: Non diabetic.  He is self pay.  In light of his night sweats he  would like to avoid labs for now if favor of watchful waiting. RTC in about 1 week for recheck is symptoms persist.  -     POCT glucose (manual entry) -     POCT urinalysis dipstick    The patient is advised to call or return to clinic if he does not see an improvement in symptoms, or to seek the care of the closest emergency department if he worsens with the above plan.   Deliah BostonMichael Clark, MHS, PA-C [facility] 08/15/2017 5:46 PM

## 2017-08-15 NOTE — Telephone Encounter (Signed)
Pt needs an appt

## 2017-08-15 NOTE — Patient Instructions (Signed)
     IF you received an x-ray today, you will receive an invoice from [facility]. Please contact [facility] at [phone] with questions or concerns regarding your invoice.   IF you received labwork today, you will receive an invoice from [facility]. Please contact [facility] at [phone] with questions or concerns regarding your invoice.   Our billing staff will not be able to assist you with questions regarding bills from these companies.  You will be contacted with the lab results as soon as they are available. The fastest way to get your results is to activate your My Chart account. Instructions are located on the last page of this paperwork. If you have not heard from us regarding the results in 2 weeks, please contact this office.     

## 2017-08-15 NOTE — Telephone Encounter (Signed)
Pt called with having night sweats to the point that he is having to dry off with a towel and then he goes back to sleep and then it happens again. He does not have a fever. He has a headache and some lightheadedness and sometimes some shortness of breath when he is outside. He has lost 7 lbs in one week. He was 220 lbs on Monday and today he is 213.  He feels like he is drinking lots of water. Appointment scheduled per protocol for today. Home care advice given to him with verbal understanding. Pt was told to call back if his symptoms increase or he feeling worst. [facility].  Reason for Disposition . [1] NIGHT SWEATS occur (e.g., drenching sweat that occurs at night and has to change bed clothes or bed sheets) AND [2] cause unknown  Answer Assessment - Initial Assessment Questions 1. ONSET: "When did the sweating start?"      This week 2. LOCATION: "What part of your body has excessive sweating?" (e.g., entire body; just face, underarms, palms, or soles of feet).      Entire body 3. SEVERITY: "How bad is the sweating?"    (Scale 1-10; or mild, moderate, severe)   -  MILD (1-3): doesn't interfere with normal activities    -  MODERATE (4-7): interferes with normal activities (e.g., work or school) or awakens from sleep; causes embarrassment in social situations    -  SEVERE (8-10): drenching sweats and has to change bed clothes or bed linens.     severe 4. CAUSE: "What do you think is causing the sweating?"     no 5. FEVER: "Have you been having fevers?"     no 6. OTHER SYMPTOMS: "Do you have any other symptoms?" (e.g., chest pain, difficulty breathing, lightheadedness, weight loss)     Lightheadedness, tired, sometimes difficulty breathing when outside, weight loss  Protocols used: [email or web address]

## 2018-02-10 ENCOUNTER — Encounter (HOSPITAL_COMMUNITY): Payer: Self-pay | Admitting: Emergency Medicine

## 2018-02-10 ENCOUNTER — Ambulatory Visit (HOSPITAL_COMMUNITY)
Admission: EM | Admit: 2018-02-10 | Discharge: 2018-02-10 | Disposition: A | Discharge status: Home / Self Care | Payer: Self-pay | Attending: Family Medicine | Admitting: Family Medicine

## 2018-02-10 DIAGNOSIS — R69 Illness, unspecified: Secondary | ICD-10-CM | POA: Insufficient documentation

## 2018-02-10 DIAGNOSIS — J111 Influenza due to unidentified influenza virus with other respiratory manifestations: Secondary | ICD-10-CM

## 2018-02-10 MED ORDER — OSELTAMIVIR PHOSPHATE 75 MG PO CAPS: 75.0000 mg | ORAL_CAPSULE | Freq: Two times a day (BID) | ORAL | 0 refills | Status: DC

## 2018-02-10 MED ORDER — IBUPROFEN 800 MG PO TABS: 800.0000 mg | ORAL_TABLET | Freq: Three times a day (TID) | ORAL | 0 refills | Status: AC

## 2018-02-10 NOTE — ED Provider Notes (Signed)
[facility]    CSN: [id number] Arrival date & time: 02/10/18  1833     History   Chief Complaint Chief Complaint  Patient presents with  . Cough    HPI David Schmitt is a 33 y.o. male.   HPI  Patient states that he felt yesterday.  This morning when he got up he had pain across his shoulders and back, pain in his chest.  He went to work and started coughing.  He states the body pain and fatigue got worse.  He went home from work.  Now he has some sweats and chills.  No known exposure to influenza but he states a lot of people at work have been coming and sick.  His wife and children are well.  He did not get a flu shot this year.  Past Medical History:  Diagnosis Date  . Bell's palsy   . Cleft lip     There are no active problems to display for this patient.   Past Surgical History:  Procedure Laterality Date  . HIP SURGERY         Home Medications    Prior to Admission medications   Medication Sig Start Date End Date Taking? Authorizing Provider  ibuprofen (ADVIL,MOTRIN) 800 MG tablet Take 1 tablet (800 mg total) by mouth 3 (three) times daily. 02/10/18   Eustace Moore, MD  oseltamivir (TAMIFLU) 75 MG capsule Take 1 capsule (75 mg total) by mouth every 12 (twelve) hours. 02/10/18   Eustace Moore, MD    Family History No family history on file.  Social History Social History   Tobacco Use  . Smoking status: Current Every Day Smoker    Packs/day: 1.00    Years: 5.00    Pack years: 5.00    Types: Cigarettes  . Smokeless tobacco: Never Used  Substance Use Topics  . Alcohol use: Yes    Comment: weekends  . Drug use: No     Allergies   Patient has no known allergies.   Review of Systems Review of Systems   Physical Exam Triage Vital Signs ED Triage Vitals  Enc Vitals Group     BP 02/10/18 1914 139/76     Pulse Rate 02/10/18 1914 98     Resp 02/10/18 1914 16     Temp 02/10/18 1914 98.5 F (36.9 C)     Temp Source 02/10/18  1926 Oral     SpO2 02/10/18 1914 97 %     Weight --      Height --      Head Circumference --      Peak Flow --      Pain Score 02/10/18 1914 7     Pain Loc --      Pain Edu? --      Excl. in GC? --   Repeat temperature 100.4 No data found.  Updated Vital Signs BP 139/76   Pulse 98   Temp (!) 100.4 F (38 C) (Oral)   Resp 16   SpO2 97%      Physical Exam Vitals signs reviewed.  Constitutional:      General: He is not in acute distress.    Appearance: He is well-developed and normal weight. He is ill-appearing.  HENT:     Head: Normocephalic and atraumatic.     Right Ear: Tympanic membrane and ear canal normal.     Left Ear: Tympanic membrane, ear canal and external ear normal.  Nose: No congestion.     Mouth/Throat:     Mouth: Mucous membranes are moist.     Comments: Posterior pharynx is slightly red.  Mouth is dry Eyes:     Conjunctiva/sclera: Conjunctivae normal.     Pupils: Pupils are equal, round, and reactive to light.  Neck:     Musculoskeletal: Normal range of motion and neck supple.  Cardiovascular:     Rate and Rhythm: Normal rate and regular rhythm.     Heart sounds: Normal heart sounds.  Pulmonary:     Effort: Pulmonary effort is normal. No respiratory distress.     Breath sounds: Normal breath sounds.  Abdominal:     General: Abdomen is flat. There is no distension.     Palpations: Abdomen is soft.  Musculoskeletal: Normal range of motion.  Lymphadenopathy:     Cervical: No cervical adenopathy.  Skin:    General: Skin is warm and dry.  Neurological:     Mental Status: He is alert.  Psychiatric:        Mood and Affect: Mood normal.        Thought Content: Thought content normal.      UC Treatments / Results  Labs (all labs ordered are listed, but only abnormal results are displayed) Labs Reviewed - No data to display  EKG None  Radiology No results found.  Procedures Procedures (including critical care time)  Medications  Ordered in UC Medications - No data to display  Initial Impression / Assessment and Plan / UC Course  I have reviewed the triage vital signs and the nursing notes.  Pertinent labs & imaging results that were available during my care of the patient were reviewed by me and considered in my medical decision making (see chart for details).     I explained to the patient the sudden onset of body aches fever and cough are characteristic for influenza.  We discussed treatment.  I told him that Tamiflu may cause side effects, but might also help him get better lipid sooner.  He chooses to take Tamiflu.  With less than 24 hours symptoms I think this is good timing.  He is told to take with food and watch for nausea.  I also gave him ibuprofen for the body aches and fever.  We discussed contagiousness.  Reasons for return Final Clinical Impressions(s) / UC Diagnoses   Final diagnoses:  Influenza-like illness     Discharge Instructions     Your symptoms indicate an influenza illness Take the Tamiflu 2 times a day Take this with food Take ibuprofen 3 times a day with food this is for pain and fever Get plenty of rest Push fluids This will likely take a few days to pass   ED Prescriptions    Medication Sig Dispense Auth. Provider   oseltamivir (TAMIFLU) 75 MG capsule Take 1 capsule (75 mg total) by mouth every 12 (twelve) hours. 10 capsule Eustace Moore, MD   ibuprofen (ADVIL,MOTRIN) 800 MG tablet Take 1 tablet (800 mg total) by mouth 3 (three) times daily. 21 tablet Eustace Moore, MD     Controlled Substance Prescriptions [place] Controlled Substance Registry consulted? Not Applicable   Eustace Moore, MD 02/10/18 [phone]

## 2018-02-10 NOTE — ED Triage Notes (Signed)
Pt c/o cough, body aches, congestion, back pain since this morning.

## 2018-02-10 NOTE — Discharge Instructions (Addendum)
Your symptoms indicate an influenza illness Take the Tamiflu 2 times a day Take this with food Take ibuprofen 3 times a day with food this is for pain and fever Get plenty of rest Push fluids This will likely take a few days to pass

## 2018-03-09 ENCOUNTER — Other Ambulatory Visit: Payer: Self-pay

## 2018-03-09 ENCOUNTER — Ambulatory Visit (HOSPITAL_COMMUNITY)
Admission: EM | Admit: 2018-03-09 | Discharge: 2018-03-09 | Disposition: A | Discharge status: Home / Self Care | Payer: Self-pay | Attending: Family Medicine | Admitting: Family Medicine

## 2018-03-09 ENCOUNTER — Encounter (HOSPITAL_COMMUNITY): Payer: Self-pay | Admitting: Emergency Medicine

## 2018-03-09 DIAGNOSIS — B349 Viral infection, unspecified: Secondary | ICD-10-CM

## 2018-03-09 LAB — POCT RAPID STREP A: Streptococcus, Group A Screen (Direct): NEGATIVE

## 2018-03-09 MED ORDER — ACETAMINOPHEN 325 MG PO TABS
650.0000 mg | ORAL_TABLET | Freq: Once | ORAL | Status: AC
  Administered 2018-03-09: 650 mg via ORAL

## 2018-03-09 MED ORDER — ACETAMINOPHEN 325 MG PO TABS
ORAL_TABLET | ORAL | Status: AC
  Filled 2018-03-09: qty 2

## 2018-03-09 NOTE — ED Provider Notes (Signed)
[facility]    CSN: [id number] Arrival date & time: 03/09/18  1202     History   Chief Complaint Chief Complaint  Patient presents with  . Fever  . Generalized Body Aches  . Sore Throat    HPI David Schmitt is a 33 y.o. male.   Patient is a 33 year old male that presents with nasal congestion, mild cough, body aches, sore throat, fever.  The or symptoms being the sore throat.  He has had some postnasal drip.  His symptoms have been worse since last night.  He was seen here on 02/10/2018 and diagnosed with flulike illness.  He was given Tamiflu and ibuprofen which he finished.  He did have improvement of symptoms from this treatment.  Since his symptoms have returned he has been taking ibuprofen for the body aches and fever, no other medicines. He denies any recent sick contacts or recent traveling.   ROS per HPI      Past Medical History:  Diagnosis Date  . Bell's palsy   . Cleft lip     There are no active problems to display for this patient.   Past Surgical History:  Procedure Laterality Date  . HIP SURGERY         Home Medications    Prior to Admission medications   Medication Sig Start Date End Date Taking? Authorizing Provider  ibuprofen (ADVIL,MOTRIN) 800 MG tablet Take 1 tablet (800 mg total) by mouth 3 (three) times daily. 02/10/18  Yes Eustace MooreNelson, Yvonne Sue, MD    Family History History reviewed. No pertinent family history.  Social History Social History   Tobacco Use  . Smoking status: Current Every Day Smoker    Packs/day: 1.00    Years: 5.00    Pack years: 5.00    Types: Cigarettes  . Smokeless tobacco: Never Used  Substance Use Topics  . Alcohol use: Yes    Comment: weekends  . Drug use: No     Allergies   Patient has no known allergies.   Review of Systems Review of Systems   Physical Exam Triage Vital Signs ED Triage Vitals  Enc Vitals Group     BP 03/09/18 1303 121/64     Pulse Rate 03/09/18 1303 60     Resp  --      Temp 03/09/18 1303 (!) 101.7 F (38.7 C)     Temp Source 03/09/18 1303 Oral     SpO2 03/09/18 1303 99 %     Weight --      Height --      Head Circumference --      Peak Flow --      Pain Score 03/09/18 1305 3     Pain Loc --      Pain Edu? --      Excl. in GC? --    No data found.  Updated Vital Signs BP 121/64 (BP Location: Right Arm)   Pulse 60   Temp (!) 101.7 F (38.7 C) (Oral)   SpO2 99%   Visual Acuity Right Eye Distance:   Left Eye Distance:   Bilateral Distance:    Right Eye Near:   Left Eye Near:    Bilateral Near:     Physical Exam Vitals signs and nursing note reviewed.  Constitutional:      General: He is not in acute distress.    Appearance: He is well-developed. He is not ill-appearing or toxic-appearing.  HENT:     Head: Normocephalic  and atraumatic.     Right Ear: Tympanic membrane and ear canal normal.     Left Ear: Tympanic membrane and ear canal normal.     Nose: Congestion and rhinorrhea present.     Mouth/Throat:     Pharynx: Posterior oropharyngeal erythema present.     Tonsils: No tonsillar exudate or tonsillar abscesses.  Eyes:     Conjunctiva/sclera: Conjunctivae normal.  Neck:     Musculoskeletal: Normal range of motion and neck supple.  Cardiovascular:     Rate and Rhythm: Normal rate and regular rhythm.     Heart sounds: No murmur.  Pulmonary:     Effort: Pulmonary effort is normal. No respiratory distress.     Breath sounds: Normal breath sounds.  Lymphadenopathy:     Cervical: No cervical adenopathy.  Skin:    General: Skin is warm and dry.  Neurological:     Mental Status: He is alert.  Psychiatric:        Mood and Affect: Mood normal.      UC Treatments / Results  Labs (all labs ordered are listed, but only abnormal results are displayed) Labs Reviewed  POCT RAPID STREP A    EKG None  Radiology No results found.  Procedures Procedures (including critical care time)  Medications Ordered in  UC Medications  acetaminophen (TYLENOL) tablet 650 mg (650 mg Oral Given 03/09/18 1309)    Initial Impression / Assessment and Plan / UC Course  I have reviewed the triage vital signs and the nursing notes.  Pertinent labs & imaging results that were available during my care of the patient were reviewed by me and considered in my medical decision making (see chart for details).     Rapid strep test was negative This is most likely a viral illness Symptomatic treatment Follow up as needed for continued or worsening symptoms  Final Clinical Impressions(s) / UC Diagnoses   Final diagnoses:  Viral syndrome     Discharge Instructions     Rapid strep test is negative here This is most likely a viral illness. You can take tylenol or ibuprofen for the body aches, fever and sore throat.  Zyrtec D for congestion and throat drainage.  Follow up as needed for continued or worsening symptoms     ED Prescriptions    None     Controlled Substance Prescriptions [place] Controlled Substance Registry consulted? Not Applicable   Janace ArisBast, Royelle Hinchman A, NP 03/09/18 1353

## 2018-03-09 NOTE — ED Triage Notes (Signed)
Pt reports a fever of 101.9 last night with body aches and right sided throat pain.  Pt states when he woke up this morning the pain on the right side had gone away and it is now on the left side.  Pt was treated for the flu one month ago and he took Tamiflu and Ibuprofen.

## 2018-03-09 NOTE — Discharge Instructions (Signed)
Rapid strep test is negative here This is most likely a viral illness. You can take tylenol or ibuprofen for the body aches, fever and sore throat.  Zyrtec D for congestion and throat drainage.  Follow up as needed for continued or worsening symptoms

## 2020-05-22 ENCOUNTER — Encounter (HOSPITAL_COMMUNITY): Payer: Self-pay | Admitting: Emergency Medicine

## 2020-05-22 ENCOUNTER — Other Ambulatory Visit: Payer: Self-pay

## 2020-05-22 ENCOUNTER — Ambulatory Visit (HOSPITAL_COMMUNITY)
Admission: EM | Admit: 2020-05-22 | Discharge: 2020-05-22 | Disposition: A | Discharge status: Home / Self Care | Payer: Self-pay | Attending: Physician Assistant | Admitting: Physician Assistant

## 2020-05-22 DIAGNOSIS — Z20822 Contact with and (suspected) exposure to covid-19: Secondary | ICD-10-CM | POA: Insufficient documentation

## 2020-05-22 DIAGNOSIS — Z87891 Personal history of nicotine dependence: Secondary | ICD-10-CM | POA: Insufficient documentation

## 2020-05-22 DIAGNOSIS — R059 Cough, unspecified: Secondary | ICD-10-CM

## 2020-05-22 DIAGNOSIS — R051 Acute cough: Secondary | ICD-10-CM | POA: Insufficient documentation

## 2020-05-22 DIAGNOSIS — Z28311 Partially vaccinated for covid-19: Secondary | ICD-10-CM | POA: Insufficient documentation

## 2020-05-22 DIAGNOSIS — J069 Acute upper respiratory infection, unspecified: Secondary | ICD-10-CM | POA: Insufficient documentation

## 2020-05-22 DIAGNOSIS — R0981 Nasal congestion: Secondary | ICD-10-CM | POA: Insufficient documentation

## 2020-05-22 LAB — SARS CORONAVIRUS 2 (TAT 6-24 HRS): SARS Coronavirus 2: NEGATIVE

## 2020-05-22 LAB — POC INFLUENZA A AND B ANTIGEN (URGENT CARE ONLY)
Influenza A Ag: NEGATIVE
Influenza B Ag: NEGATIVE

## 2020-05-22 MED ORDER — PROMETHAZINE-DM 6.25-15 MG/5ML PO SYRP: 5.0000 mL | ORAL_SOLUTION | Freq: Three times a day (TID) | ORAL | 0 refills | Status: AC | PRN

## 2020-05-22 MED ORDER — FLUTICASONE PROPIONATE 50 MCG/ACT NA SUSP: 1.0000 | Freq: Every day | NASAL | 0 refills | Status: AC

## 2020-05-22 NOTE — Discharge Instructions (Addendum)
The cough medication that you are prescribed to make you sleepy.  You should not drive or drink alcohol while taking it.  I would recommend primarily taking it at night but you can take it up to 3 times a day as long as you are home.  Use Flonase for nasal congestion.  Make sure you are drinking plenty of fluids.

## 2020-05-22 NOTE — ED Triage Notes (Signed)
Pt presents today with cough, sore throat and nasal congestion since Friday. Denies fever.

## 2020-05-22 NOTE — ED Provider Notes (Signed)
[facility]    CSN: [id number] Arrival date & time: 05/22/20  3716      History   Chief Complaint Chief Complaint  Patient presents with  . Nasal Congestion  . Cough  . Sore Throat    HPI David Schmitt is a 35 y.o. male.   Patient presents today with a 4-day history of cough.  Reports cough has been worsening and is now producing thick colored urine.  He reports associated nasal congestion, sinus pressure, headache.  Denies any fever, change in sense of taste or smell, nausea, vomiting, shortness of breath, chest pain.  He has tried ibuprofen and Robitussin without improvement of symptoms.  He is up-to-date on COVID-19 vaccines but has not had booster.  He does not receive annual influenza vaccine.  He denies any recent antibiotic use.  He denies history of allergies, asthma, COPD.  He is a former smoker quit approximately 5 years ago.  Reports his daughter was sick with similar symptoms and was prescribed an antibiotic and is now feeling better.     Past Medical History:  Diagnosis Date  . Bell's palsy   . Cleft lip     There are no problems to display for this patient.   Past Surgical History:  Procedure Laterality Date  . HIP SURGERY         Home Medications    Prior to Admission medications   Medication Sig Start Date End Date Taking? Authorizing Provider  fluticasone (FLONASE) 50 MCG/ACT nasal spray Place 1 spray into both nostrils daily. 05/22/20  Yes Radley Teston K, PA-C  ibuprofen (ADVIL,MOTRIN) 800 MG tablet Take 1 tablet (800 mg total) by mouth 3 (three) times daily. 02/10/18  Yes Eustace Moore, MD  promethazine-dextromethorphan (PROMETHAZINE-DM) 6.25-15 MG/5ML syrup Take 5 mLs by mouth 3 (three) times daily as needed for cough. 05/22/20  Yes [place], Noberto Retort, PA-C    Family History Family History  Problem Relation Age of Onset  . Healthy Mother   . Healthy Father     Social History Social History   Tobacco Use  . Smoking status:  Former Smoker    Packs/day: 1.00    Years: 5.00    Pack years: 5.00    Types: Cigarettes    Quit date: 2015    Years since quitting: 7.2  . Smokeless tobacco: Never Used  Vaping Use  . Vaping Use: Never used  Substance Use Topics  . Alcohol use: Yes    Comment: weekends  . Drug use: No     Allergies   Patient has no known allergies.   Review of Systems Review of Systems  Constitutional: Negative for activity change, appetite change, fatigue and fever.  HENT: Positive for congestion, postnasal drip, sinus pressure and sore throat. Negative for sneezing.   Respiratory: Positive for cough. Negative for shortness of breath.   Cardiovascular: Negative for chest pain.  Gastrointestinal: Negative for abdominal pain, diarrhea, nausea and vomiting.  Musculoskeletal: Negative for arthralgias and myalgias.  Neurological: Positive for headaches. Negative for dizziness and light-headedness.     Physical Exam Triage Vital Signs ED Triage Vitals  Enc Vitals Group     BP 05/22/20 0819 119/72     Pulse Rate 05/22/20 0819 79     Resp 05/22/20 0819 20     Temp 05/22/20 0819 99.1 F (37.3 C)     Temp Source 05/22/20 0819 Oral     SpO2 05/22/20 0819 97 %     Weight --  Height --      Head Circumference --      Peak Flow --      Pain Score 05/22/20 0816 2     Pain Loc --      Pain Edu? --      Excl. in GC? --    No data found.  Updated Vital Signs BP 119/72 (BP Location: Left Arm)   Pulse 79   Temp 99.1 F (37.3 C) (Oral)   Resp 20   SpO2 97%   Visual Acuity Right Eye Distance:   Left Eye Distance:   Bilateral Distance:    Right Eye Near:   Left Eye Near:    Bilateral Near:     Physical Exam Vitals reviewed.  Constitutional:      General: He is awake.     Appearance: Normal appearance. He is normal weight. He is not ill-appearing.     Comments: Very pleasant male appears stated age in no acute distress  HENT:     Head: Normocephalic and atraumatic.      Right Ear: Ear canal and external ear normal. A middle ear effusion is present. Tympanic membrane is not erythematous or bulging.     Left Ear: Tympanic membrane, ear canal and external ear normal. There is impacted cerumen.     Nose:     Right Sinus: Maxillary sinus tenderness present. No frontal sinus tenderness.     Left Sinus: Maxillary sinus tenderness present. No frontal sinus tenderness.     Mouth/Throat:     Pharynx: Uvula midline. No oropharyngeal exudate, posterior oropharyngeal erythema or uvula swelling.     Comments: Moderate drainage in posterior oropharynx Cardiovascular:     Rate and Rhythm: Normal rate and regular rhythm.     Heart sounds: No murmur heard.   Pulmonary:     Effort: Pulmonary effort is normal. No accessory muscle usage or respiratory distress.     Breath sounds: Normal breath sounds. No stridor. No wheezing, rhonchi or rales.     Comments: Clear to auscultation bilaterally Abdominal:     General: Bowel sounds are normal.     Palpations: Abdomen is soft.     Tenderness: There is no abdominal tenderness.  Lymphadenopathy:     Head:     Right side of head: No submental, submandibular or tonsillar adenopathy.     Left side of head: No submental, submandibular or tonsillar adenopathy.     Cervical: No cervical adenopathy.  Neurological:     Mental Status: He is alert.  Psychiatric:        Behavior: Behavior is cooperative.      UC Treatments / Results  Labs (all labs ordered are listed, but only abnormal results are displayed) Labs Reviewed  SARS CORONAVIRUS 2 (TAT 6-24 HRS)  POC INFLUENZA A AND B ANTIGEN (URGENT CARE ONLY)    EKG   Radiology No results found.  Procedures Procedures (including critical care time)  Medications Ordered in UC Medications - No data to display  Initial Impression / Assessment and Plan / UC Course  I have reviewed the triage vital signs and the nursing notes.  Pertinent labs & imaging results that were  available during my care of the patient were reviewed by me and considered in my medical decision making (see chart for details).     Suspect viral etiology given short duration of symptoms.  Flu testing was negative in office and COVID-19 testing is pending.  Patient was started remain in  isolation until COVID-19 results are obtained.  We will treat symptomatically with Flonase for nasal congestion Promethazine DM for cough.  He was instructed not to drive or drink alcohol with Promethazine DM as drowsiness is a common side effect.  Recommended he use Mucinex for additional symptom relief.  Strict return precautions given to which patient expressed understanding.  Final Clinical Impressions(s) / UC Diagnoses   Final diagnoses:  Cough  Nasal congestion  Upper respiratory tract infection, unspecified type     Discharge Instructions     The cough medication that you are prescribed to make you sleepy.  You should not drive or drink alcohol while taking it.  I would recommend primarily taking it at night but you can take it up to 3 times a day as long as you are home.  Use Flonase for nasal congestion.  Make sure you are drinking plenty of fluids.    ED Prescriptions    Medication Sig Dispense Auth. Provider   fluticasone (FLONASE) 50 MCG/ACT nasal spray Place 1 spray into both nostrils daily. 16 mL Kaytlyn Din K, PA-C   promethazine-dextromethorphan (PROMETHAZINE-DM) 6.25-15 MG/5ML syrup Take 5 mLs by mouth 3 (three) times daily as needed for cough. 118 mL Rockie Vawter K, PA-C     PDMP not reviewed this encounter.   Jeani Hawking, PA-C 05/22/20 2992
